# Patient Record
Sex: Male | Born: 1980 | Race: Black or African American | Hispanic: No | Marital: Married | Smoking: Never smoker
Health system: Southern US, Community
[De-identification: ages and names within clinical notes are randomized; demographics above are authoritative.]

## PROBLEM LIST (undated history)

## (undated) DIAGNOSIS — T79A22A Traumatic compartment syndrome of left lower extremity, initial encounter: Secondary | ICD-10-CM

## (undated) DIAGNOSIS — T6701XA Heatstroke and sunstroke, initial encounter: Secondary | ICD-10-CM

## (undated) DIAGNOSIS — I2699 Other pulmonary embolism without acute cor pulmonale: Secondary | ICD-10-CM

## (undated) DIAGNOSIS — I82409 Acute embolism and thrombosis of unspecified deep veins of unspecified lower extremity: Secondary | ICD-10-CM

## (undated) DIAGNOSIS — G43909 Migraine, unspecified, not intractable, without status migrainosus: Secondary | ICD-10-CM

---

## 2001-11-02 DIAGNOSIS — T79A22A Traumatic compartment syndrome of left lower extremity, initial encounter: Secondary | ICD-10-CM

## 2001-11-02 DIAGNOSIS — T6701XA Heatstroke and sunstroke, initial encounter: Secondary | ICD-10-CM

## 2001-11-02 HISTORY — DX: Heatstroke and sunstroke, initial encounter: T67.01XA

## 2001-11-02 HISTORY — PX: OTHER SURGICAL HISTORY: SHX169

## 2001-11-02 HISTORY — DX: Traumatic compartment syndrome of left lower extremity, initial encounter: T79.A22A

## 2016-07-05 ENCOUNTER — Encounter (HOSPITAL_BASED_OUTPATIENT_CLINIC_OR_DEPARTMENT_OTHER): Payer: Self-pay | Admitting: *Deleted

## 2016-07-05 ENCOUNTER — Emergency Department (HOSPITAL_BASED_OUTPATIENT_CLINIC_OR_DEPARTMENT_OTHER)
Admission: EM | Admit: 2016-07-05 | Discharge: 2016-07-05 | Disposition: A | Discharge status: Home / Self Care | Payer: Non-veteran care | Attending: Emergency Medicine | Admitting: Emergency Medicine

## 2016-07-05 ENCOUNTER — Emergency Department (HOSPITAL_BASED_OUTPATIENT_CLINIC_OR_DEPARTMENT_OTHER): Payer: Non-veteran care

## 2016-07-05 DIAGNOSIS — S8392XA Sprain of unspecified site of left knee, initial encounter: Secondary | ICD-10-CM | POA: Diagnosis not present

## 2016-07-05 DIAGNOSIS — X501XXA Overexertion from prolonged static or awkward postures, initial encounter: Secondary | ICD-10-CM | POA: Insufficient documentation

## 2016-07-05 DIAGNOSIS — S8992XA Unspecified injury of left lower leg, initial encounter: Secondary | ICD-10-CM | POA: Diagnosis present

## 2016-07-05 DIAGNOSIS — Y999 Unspecified external cause status: Secondary | ICD-10-CM | POA: Insufficient documentation

## 2016-07-05 DIAGNOSIS — Y929 Unspecified place or not applicable: Secondary | ICD-10-CM | POA: Diagnosis not present

## 2016-07-05 DIAGNOSIS — Y9301 Activity, walking, marching and hiking: Secondary | ICD-10-CM | POA: Diagnosis not present

## 2016-07-05 HISTORY — DX: Heatstroke and sunstroke, initial encounter: T67.01XA

## 2016-07-05 HISTORY — DX: Traumatic compartment syndrome of left lower extremity, initial encounter: T79.A22A

## 2016-07-05 MED ORDER — IBUPROFEN 800 MG PO TABS: 800.0000 mg | ORAL_TABLET | Freq: Three times a day (TID) | ORAL | 0 refills | Status: AC

## 2016-07-05 MED ORDER — IBUPROFEN 800 MG PO TABS
800.0000 mg | ORAL_TABLET | Freq: Once | ORAL | Status: AC
  Administered 2016-07-05: 800 mg via ORAL
  Filled 2016-07-05: qty 1

## 2016-07-05 NOTE — Discharge Instructions (Signed)

## 2016-07-05 NOTE — ED Provider Notes (Signed)
[facility] Provider Note   CSN: [id number] Arrival date & time: 07/05/16  0941     History   Chief Complaint Chief Complaint  Patient presents with  . Knee Pain    HPI Robert Rodriguez is a 35 y.o. male.  The patient is a 35 year old male, he presents to the hospital approximately 2 days after falling while walking downstairs, missed a stair, twisted his knee and fell. He reports that he was able to get up and walk but has had significant knee pain on the left since that time. 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 The patient has no other injuries including neck back or head. He does endorse mild amount of swelling around the left knee. He feels as though his knee is going to give out when he walks    Knee Pain      Past Medical History:  Diagnosis Date  . 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 Heat stroke 2003    There are no active problems to display for this patient.   Past Surgical History:  Procedure Laterality Date  . compartment syndrome Left 2003   with skin graft       Home Medications    Prior to Admission medications   Medication Sig Start Date End Date Taking? Authorizing Provider  ibuprofen (ADVIL,MOTRIN) 800 MG tablet Take 1 tablet (800 mg total) by mouth 3 (three) times daily. 07/05/16   Eber Hong, MD    Family History No family history on file.  Social History Social History  Substance Use Topics  . Smoking status: Never Smoker  . Smokeless tobacco: Never Used  . Alcohol use Yes     Comment: several times monthly     Allergies   Review of patient's allergies indicates no known allergies.   Review of Systems Review of Systems  All other systems reviewed and are negative.    Physical Exam Updated Vital Signs BP 120/99  (BP Location: Right Arm)   Pulse 74   Temp 98.2 F (36.8 C) (Oral)   Resp 18   Ht 5\' 7"  (1.702 m)   Wt 225 lb (102.1 kg)   SpO2 98%   BMI 35.24 kg/m   Physical Exam  Constitutional: He appears well-developed and well-nourished. No distress.  HENT:  Head: Normocephalic and atraumatic.  Eyes: Conjunctivae are normal. No scleral icterus.  Cardiovascular: Normal rate, regular rhythm and intact distal pulses.   Pulmonary/Chest: Effort normal and breath sounds normal.  Musculoskeletal: He exhibits tenderness ( ttp over the superior and medial knee - minimal pain with lateralizing movement of the patella.  ). He exhibits no edema.  Able to straight leg raise, extensor mechanism intact, no ttp over the patellar tendon, no joint laxity - no drawer sign.  Pain with medial stress and along the medial joint line  Neurological: He is alert.  Able to straight leg raise bilaterally, mild weakness with dorsiflexion of the L foot at the ankle  Skin: Skin is warm and dry. No rash noted. He is not diaphoretic.  Scarring from fasciotomy well healed.    Nursing note and vitals reviewed.    ED Treatments / Results  Labs (all labs ordered are listed, but only abnormal results are displayed) Labs Reviewed - No data to  display   Radiology Dg Knee Complete 4 Views Left  Result Date: 07/05/2016 CLINICAL DATA:  Acute left knee pain following fall 3 days ago. Initial encounter. EXAM: LEFT KNEE - COMPLETE 4+ VIEW COMPARISON:  None. FINDINGS: No evidence of fracture, dislocation, or joint effusion. No evidence of arthropathy or other focal bone abnormality. Soft tissues are unremarkable. IMPRESSION: Negative. Electronically Signed   By: Harmon PierJeffrey  Hu M.D.   On: 07/05/2016 10:35    Procedures Procedures (including critical care time)  Medications Ordered in ED Medications  ibuprofen (ADVIL,MOTRIN) tablet 800 mg (800 mg Oral Given 07/05/16 1020)     Initial Impression / Assessment and Plan / ED Course  I  have reviewed the triage vital signs and the nursing notes.  Pertinent labs & imaging results that were available during my care of the patient were reviewed by me and considered in my medical decision making (see chart for details).  Clinical Course    Xray negative - pt well appearing, stable for d/c with immobilizer, meds and RICE therapy.  Possible internal derangement - needs ortho / sports med f/u - information give for Dr. Pearletha Forgehudnall - [facility] goes [facility].  Final Clinical Impressions(s) / ED Diagnoses   Final diagnoses:  Left knee sprain, initial encounter    New Prescriptions New Prescriptions   IBUPROFEN (ADVIL,MOTRIN) 800 MG TABLET    Take 1 tablet (800 mg total) by mouth 3 (three) times daily.     Eber HongBrian Monik Lins, MD 07/05/16 [phone]

## 2016-07-05 NOTE — ED Triage Notes (Signed)
Pt reports this past Friday he was going down some stairs, missed a step and twisted his L knee. Reports mild, intermittent numbness/tingling in toes but states it's from an old injury.

## 2016-07-05 NOTE — ED Notes (Signed)
MD at bedside. 

## 2016-07-05 NOTE — ED Notes (Signed)
Pt expresses relief upon immobilizer application.

## 2018-07-06 ENCOUNTER — Encounter (HOSPITAL_COMMUNITY): Payer: Self-pay | Admitting: Emergency Medicine

## 2018-07-06 ENCOUNTER — Emergency Department (HOSPITAL_COMMUNITY)
Admission: EM | Admit: 2018-07-06 | Discharge: 2018-07-06 | Disposition: A | Discharge status: Home / Self Care | Payer: Non-veteran care | Attending: Emergency Medicine | Admitting: Emergency Medicine

## 2018-07-06 ENCOUNTER — Emergency Department (HOSPITAL_COMMUNITY): Payer: Non-veteran care

## 2018-07-06 DIAGNOSIS — J189 Pneumonia, unspecified organism: Secondary | ICD-10-CM

## 2018-07-06 DIAGNOSIS — J181 Lobar pneumonia, unspecified organism: Secondary | ICD-10-CM | POA: Insufficient documentation

## 2018-07-06 DIAGNOSIS — R109 Unspecified abdominal pain: Secondary | ICD-10-CM | POA: Diagnosis present

## 2018-07-06 LAB — CBC WITH DIFFERENTIAL/PLATELET
BASOS ABS: 0.2 10*3/uL — AB (ref 0.0–0.1)
Basophils Relative: 1 %
EOS ABS: 8.7 10*3/uL — AB (ref 0.0–0.7)
Eosinophils Relative: 45 %
HCT: 42.4 % (ref 39.0–52.0)
HEMOGLOBIN: 13.9 g/dL (ref 13.0–17.0)
LYMPHS PCT: 10 %
Lymphs Abs: 1.9 10*3/uL (ref 0.7–4.0)
MCH: 28.4 pg (ref 26.0–34.0)
MCHC: 32.8 g/dL (ref 30.0–36.0)
MCV: 86.7 fL (ref 78.0–100.0)
Monocytes Absolute: 0.8 10*3/uL (ref 0.1–1.0)
Monocytes Relative: 4 %
Neutro Abs: 7.7 10*3/uL (ref 1.7–7.7)
Neutrophils Relative %: 40 %
Platelets: 40 10*3/uL — ABNORMAL LOW (ref 150–400)
RBC: 4.89 MIL/uL (ref 4.22–5.81)
RDW: 12.2 % (ref 11.5–15.5)
WBC: 19.3 10*3/uL — AB (ref 4.0–10.5)

## 2018-07-06 LAB — COMPREHENSIVE METABOLIC PANEL
ALT: 109 U/L — AB (ref 0–44)
ANION GAP: 9 (ref 5–15)
AST: 51 U/L — ABNORMAL HIGH (ref 15–41)
Albumin: 3.5 g/dL (ref 3.5–5.0)
Alkaline Phosphatase: 138 U/L — ABNORMAL HIGH (ref 38–126)
BUN: 11 mg/dL (ref 6–20)
CHLORIDE: 102 mmol/L (ref 98–111)
CO2: 27 mmol/L (ref 22–32)
Calcium: 8.6 mg/dL — ABNORMAL LOW (ref 8.9–10.3)
Creatinine, Ser: 1.31 mg/dL — ABNORMAL HIGH (ref 0.61–1.24)
GFR calc Af Amer: >60 mL/min (ref >60)
Glucose, Bld: 112 mg/dL — ABNORMAL HIGH (ref 70–99)
POTASSIUM: 3.7 mmol/L (ref 3.5–5.1)
SODIUM: 138 mmol/L (ref 135–145)
Total Bilirubin: 1.1 mg/dL (ref 0.3–1.2)
Total Protein: 7 g/dL (ref 6.5–8.1)

## 2018-07-06 LAB — URINALYSIS, ROUTINE W REFLEX MICROSCOPIC
BILIRUBIN URINE: NEGATIVE
GLUCOSE, UA: NEGATIVE mg/dL
Ketones, ur: 5 mg/dL — AB
Leukocytes, UA: NEGATIVE
NITRITE: NEGATIVE
PROTEIN: NEGATIVE mg/dL
Specific Gravity, Urine: 1.025 (ref 1.005–1.030)
pH: 5 (ref 5.0–8.0)

## 2018-07-06 LAB — CK: CK TOTAL: 559 U/L — AB (ref 49–397)

## 2018-07-06 MED ORDER — IOPAMIDOL (ISOVUE-300) INJECTION 61%
INTRAVENOUS | Status: AC
  Filled 2018-07-06: qty 100

## 2018-07-06 MED ORDER — CEPHALEXIN 500 MG PO CAPS: 500.0000 mg | ORAL_CAPSULE | Freq: Four times a day (QID) | ORAL | 0 refills | Status: AC

## 2018-07-06 MED ORDER — IOPAMIDOL (ISOVUE-M 300) INJECTION 61%
15.0000 mL | Freq: Once | INTRAMUSCULAR | Status: AC | PRN
  Administered 2018-07-06: 15 mL via INTRATHECAL

## 2018-07-06 MED ORDER — SODIUM CHLORIDE 0.9 % IV BOLUS
2000.0000 mL | Freq: Once | INTRAVENOUS | Status: AC
  Administered 2018-07-06: 2000 mL via INTRAVENOUS

## 2018-07-06 MED ORDER — ACETAMINOPHEN 500 MG PO TABS
1000.0000 mg | ORAL_TABLET | Freq: Once | ORAL | Status: AC
Start: 2018-07-06 — End: 2018-07-06
  Administered 2018-07-06: 1000 mg via ORAL
  Filled 2018-07-06: qty 2

## 2018-07-06 MED ORDER — AZITHROMYCIN 250 MG PO TABS: 250.0000 mg | ORAL_TABLET | Freq: Every day | ORAL | 0 refills | Status: AC

## 2018-07-06 NOTE — ED Provider Notes (Signed)
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  He reports that he has not been drinking well and has not voided since 638 this morning.  He reports a headache and muscle cramps in his bilateral lower legs.  He also reports that he has been having flank pain over the last few days.  No treatment prior to arrival.  ROS: Muscle cramps, fever, tachycardia, flank pain   Physical Exam:   Gen: No distress  Neuro: Awake and Alert  Skin: Warm    Focused Exam: Abdomen is soft, non-distended. NAD.  Tachycardia.   Initiation of care has begun. The patient has been counseled on the process, plan, and necessity for staying for the completion/evaluation, and the remainder of the medical screening examination    Barkley Boards, PA-C 07/06/18 1703    Arby Barrette, MD 07/21/18 1050

## 2018-07-06 NOTE — Discharge Instructions (Signed)
Take tylenol 2 pills 4 times a day and motrin 4 pills 3 times a day.  Drink plenty of fluids.  Return for worsening shortness of breath, headache, confusion. Follow up with your family doctor.   

## 2018-07-06 NOTE — ED Notes (Signed)
Pt now endorsing right flank pain x2 days with "dark" urine. Temp noted to be 102.5.

## 2018-07-06 NOTE — ED Triage Notes (Signed)
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 Pt reports today began having a headache, leg cramps, and "wasn't sweating as much." pt in NAD at this time.

## 2018-07-06 NOTE — ED Notes (Signed)
Patient transported to CT SCAN . 

## 2018-07-06 NOTE — ED Provider Notes (Signed)
[facility] EMERGENCY DEPARTMENT Provider Note   CSN: [id number] Arrival date & time: 07/06/18  1618     History   Chief Complaint Chief Complaint  Patient presents with  . Heat Exposure    HPI Robert Rodriguez is a 37 y.o. male.  37 yo M with a cc of possible heat stroke.  Started about 2 days ago. Having chills, back pain, headaches.  Back pain is throbbing right sided.  Today started having leg cramps.   Drank 5 bottles of water and gatorade without improvement.  1 urinary event today.  Denies n/v, cp.    The history is provided by the patient.  Illness  This is a new problem. The current episode started 2 days ago. The problem occurs constantly. The problem has been rapidly worsening. Pertinent negatives include no chest pain, no abdominal pain, no headaches and no shortness of breath. Nothing aggravates the symptoms. Nothing relieves the symptoms. He has tried nothing for the symptoms. The treatment provided no relief.    Past Medical History:  Diagnosis Date  . 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 Heat stroke 2003    There are no active problems to display for this patient.   Past Surgical History:  Procedure Laterality Date  . compartment syndrome Left 2003   with skin graft        Home Medications    Prior to Admission medications   Medication Sig Start Date End Date Taking? Authorizing Provider  ibuprofen (ADVIL,MOTRIN) 800 MG tablet Take 1 tablet (800 mg total) by mouth 3 (three) times daily. 07/05/16   Eber Hong, MD    Family History History reviewed. No pertinent family history.  Social History Social History   Tobacco Use  . Smoking status: Never Smoker  . Smokeless tobacco: Never Used  Substance Use Topics  . Alcohol use: Yes    Comment: several times monthly  . Drug use: No     Allergies   Patient has no known allergies.   Review of Systems Review of Systems  Constitutional: Positive for  chills and fever.  HENT: Negative for congestion and facial swelling.   Eyes: Negative for discharge and visual disturbance.  Respiratory: Negative for shortness of breath.   Cardiovascular: Negative for chest pain and palpitations.  Gastrointestinal: Negative for abdominal pain, diarrhea and vomiting.  Genitourinary: Positive for flank pain.  Musculoskeletal: Positive for back pain. Negative for arthralgias and myalgias.  Skin: Negative for color change and rash.  Neurological: Negative for tremors, syncope and headaches.  Psychiatric/Behavioral: Negative for confusion and dysphoric mood.     Physical Exam Updated Vital Signs BP (!) 124/98 (BP Location: Right Arm)   Pulse (!) 108   Temp (!) 102.5 F (39.2 C) (Oral)   Resp 18   SpO2 100%   Physical Exam  Constitutional: He is oriented to person, place, and time. He appears well-developed and well-nourished.  HENT:  Head: Normocephalic and atraumatic.  Swollen turbinates, posterior nasal drip erythematous vesicular lesions to the posterior oropharynx., no noted sinus ttp, tm normal bilaterally.     Eyes: Pupils are equal, round, and reactive to light. EOM are normal.  Neck: Normal range of motion. Neck supple. No JVD present.  Cardiovascular: Normal rate and regular rhythm. Exam reveals no gallop and no friction rub.  No murmur heard. Pulmonary/Chest: No respiratory distress. He has no wheezes.  Abdominal: He exhibits no distension and no mass. There is no tenderness. There is  no rebound and no guarding.  Musculoskeletal: Normal range of motion.  R cva pain  Neurological: He is alert and oriented to person, place, and time.  Skin: No rash noted. No pallor.  Psychiatric: He has a normal mood and affect. His behavior is normal.  Nursing note and vitals reviewed.    ED Treatments / Results  Labs (all labs ordered are listed, but only abnormal results are displayed) Labs Reviewed  CBC WITH DIFFERENTIAL/PLATELET - Abnormal;  Notable for the following components:      Result Value   WBC 19.3 (*)    All other components within normal limits  COMPREHENSIVE METABOLIC PANEL  URINALYSIS, ROUTINE W REFLEX MICROSCOPIC  CK    EKG None  Radiology No results found.  Procedures Procedures (including critical care time)  Medications Ordered in ED Medications  sodium chloride 0.9 % bolus 2,000 mL (has no administration in time range)     Initial Impression / Assessment and Plan / ED Course  I have reviewed the triage vital signs and the nursing notes.  Pertinent labs & imaging results that were available during my care of the patient were reviewed by me and considered in my medical decision making (see chart for details).     37 yo M with a cc of fever and right flank pain.  Going on for the past couple of days.  He feels likely to be heat stroke, has had this in the past.    My exam the patient has some epigastric tenderness.  No pain to the right upper quadrant.  Negative Murphy sign.  My concern is that he has had a fever and right-sided back pain, could this be acute cholecystitis though does not seem to be related to food intake.  He does have mild LFT elevation.  Also with significant elevation of his white blood cell count.  UA negative for infection.  We will give 2 L of fluids obtain a CT scan with contrast, chest x-ray to evaluate for pneumonia.  CT scan of the abdomen pelvis with right lower lobe pneumonia.  Gallbladder appears normal.  May be mild colonic wall thickening.  Will treat for community acquired pneumonia as the patient is coughing and febrile.  We will have him follow-up with his PCP.  8:51 PM:  I have discussed the diagnosis/risks/treatment options with the patient and family and believe the pt to be eligible for discharge home to follow-up with PCP. We also discussed returning to the ED immediately if new or worsening sx occur. We discussed the sx which are most concerning (e.g., sudden  worsening pain, fever, inability to tolerate by mouth) that necessitate immediate return. Medications administered to the patient during their visit and any new prescriptions provided to the patient are listed below.  Medications given during this visit Medications  iopamidol (ISOVUE-300) 61 % injection (has no administration in time range)  sodium chloride 0.9 % bolus 2,000 mL (0 mLs Intravenous Stopped 07/06/18 2024)  acetaminophen (TYLENOL) tablet 1,000 mg (1,000 mg Oral Given 07/06/18 1922)  iopamidol (ISOVUE-M) 61 % intrathecal injection 15 mL (15 mLs Intrathecal Contrast Given 07/06/18 1933)     The patient appears reasonably screen and/or stabilized for discharge and I doubt any other medical condition or other [facility] requiring further screening, evaluation, or treatment in the ED at this time prior to discharge.    Final Clinical Impressions(s) / ED Diagnoses   Final diagnoses:  None    ED Discharge Orders  None       Melene Plan, DO 07/06/18 2052

## 2018-07-06 NOTE — ED Notes (Signed)
Patient transported to X-ray 

## 2018-07-07 LAB — PATHOLOGIST SMEAR REVIEW

## 2020-03-25 ENCOUNTER — Other Ambulatory Visit: Payer: Self-pay

## 2020-03-25 ENCOUNTER — Emergency Department (HOSPITAL_BASED_OUTPATIENT_CLINIC_OR_DEPARTMENT_OTHER)
Admission: EM | Admit: 2020-03-25 | Discharge: 2020-03-25 | Disposition: A | Discharge status: Home / Self Care | Payer: No Typology Code available for payment source | Attending: Emergency Medicine | Admitting: Emergency Medicine

## 2020-03-25 ENCOUNTER — Encounter (HOSPITAL_BASED_OUTPATIENT_CLINIC_OR_DEPARTMENT_OTHER): Payer: Self-pay | Admitting: *Deleted

## 2020-03-25 DIAGNOSIS — Z20822 Contact with and (suspected) exposure to covid-19: Secondary | ICD-10-CM | POA: Insufficient documentation

## 2020-03-25 DIAGNOSIS — R519 Headache, unspecified: Secondary | ICD-10-CM | POA: Diagnosis present

## 2020-03-25 DIAGNOSIS — G43909 Migraine, unspecified, not intractable, without status migrainosus: Secondary | ICD-10-CM | POA: Insufficient documentation

## 2020-03-25 HISTORY — DX: Other pulmonary embolism without acute cor pulmonale: I26.99

## 2020-03-25 HISTORY — DX: Migraine, unspecified, not intractable, without status migrainosus: G43.909

## 2020-03-25 HISTORY — DX: Acute embolism and thrombosis of unspecified deep veins of unspecified lower extremity: I82.409

## 2020-03-25 LAB — SARS CORONAVIRUS 2 BY RT PCR (HOSPITAL ORDER, PERFORMED IN ~~LOC~~ HOSPITAL LAB): SARS Coronavirus 2: NEGATIVE

## 2020-03-25 MED ORDER — SODIUM CHLORIDE 0.9 % IV BOLUS
1000.0000 mL | Freq: Once | INTRAVENOUS | Status: AC
  Administered 2020-03-25: 1000 mL via INTRAVENOUS

## 2020-03-25 MED ORDER — KETOROLAC TROMETHAMINE 30 MG/ML IJ SOLN
30.0000 mg | Freq: Once | INTRAMUSCULAR | Status: AC
  Administered 2020-03-25: 30 mg via INTRAVENOUS
  Filled 2020-03-25: qty 1

## 2020-03-25 MED ORDER — PROCHLORPERAZINE EDISYLATE 10 MG/2ML IJ SOLN
10.0000 mg | Freq: Once | INTRAMUSCULAR | Status: AC
  Administered 2020-03-25: 10 mg via INTRAVENOUS
  Filled 2020-03-25: qty 2

## 2020-03-25 MED ORDER — DIPHENHYDRAMINE HCL 50 MG/ML IJ SOLN
25.0000 mg | Freq: Once | INTRAMUSCULAR | Status: AC
  Administered 2020-03-25: 25 mg via INTRAVENOUS
  Filled 2020-03-25: qty 1

## 2020-03-25 MED ORDER — ACETAMINOPHEN 500 MG PO TABS
1000.0000 mg | ORAL_TABLET | Freq: Once | ORAL | Status: AC
  Administered 2020-03-25: 1000 mg via ORAL
  Filled 2020-03-25: qty 2

## 2020-03-25 NOTE — ED Provider Notes (Signed)
[facility] EMERGENCY DEPARTMENT Provider Note   CSN: [id number] Arrival date & time: 03/25/20  1954     History Chief Complaint  Patient presents with  . Migraine    Robert Rodriguez is a 39 y.o. male who presents for evaluation of headache that began this morning.  He states that headache started off mild in nature and gradually worsened.  He tried taking a acetaminophen/caffeine pill that he has at home with no improvement in symptoms.  He states pain is like a pressure and throbbing sensation in the back of his head that wraps around to the front behind his eye.  He states that he has a history of migraines and that this feels similar.  No preceding trauma, injury, fall.  He states that he has not yet seen a neurologist.  He is supposed to see a neurologist scheduled through the [place] but has not been able to get an appointment yet.  He reports that he has not had any associated fevers, vision changes, photophobia, nausea/vomiting, numbness/weakness of his arms or legs.  The history is provided by the patient.       Past Medical History:  Diagnosis Date  . 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 DVT (deep venous thrombosis) (HCC)   . Heat stroke 2003  . Migraines   . Pulmonary embolism (HCC)     There are no problems to display for this patient.   Past Surgical History:  Procedure Laterality Date  . compartment syndrome Left 2003   with skin graft       History reviewed. No pertinent family history.  Social History   Tobacco Use  . Smoking status: Never Smoker  . Smokeless tobacco: Never Used  Substance Use Topics  . Alcohol use: Yes    Comment: several times monthly  . Drug use: No    Home Medications Prior to Admission medications   Medication Sig Start Date End Date Taking? Authorizing Provider  azithromycin (ZITHROMAX) 250 MG tablet Take 1 tablet (250 mg total) by mouth daily. Take first 2 tablets together, then 1 every day until  finished. 07/06/18   Melene Plan, DO  cephALEXin (KEFLEX) 500 MG capsule Take 1 capsule (500 mg total) by mouth 4 (four) times daily. 07/06/18   Melene Plan, DO  ibuprofen (ADVIL,MOTRIN) 800 MG tablet Take 1 tablet (800 mg total) by mouth 3 (three) times daily. 07/05/16   Eber Hong, MD    Allergies    Patient has no known allergies.  Review of Systems   Review of Systems  Constitutional: Negative for fever.  Eyes: Negative for visual disturbance.  Respiratory: Negative for shortness of breath.   Gastrointestinal: Negative for nausea and vomiting.  Neurological: Positive for headaches. Negative for weakness and numbness.  All other systems reviewed and are negative.   Physical Exam Updated Vital Signs BP 121/81 (BP Location: Right Arm)   Pulse 88   Temp 98.9 F (37.2 C)   Resp 19   Ht 5\' 7"  (1.702 m)   Wt 86.2 kg   SpO2 95%   BMI 29.76 kg/m   Physical Exam Vitals and nursing note reviewed.  Constitutional:      Appearance: He is well-developed.  HENT:     Head: Normocephalic and atraumatic.  Eyes:     General: No scleral icterus.       Right eye: No discharge.        Left eye: No discharge.  Conjunctiva/sclera: Conjunctivae normal.     Comments: PERRL. EOMs intact. No nystagmus. No neglect.   Neck:     Comments: Neck is supple and without rigidity.  Pulmonary:     Effort: Pulmonary effort is normal.  Abdominal:     Comments: Abdomen is soft, non-distended, non-tender. No rigidity, No guarding. No peritoneal signs.  Skin:    General: Skin is warm and dry.  Neurological:     Mental Status: He is alert.     Comments: Cranial nerves III-XII intact Follows commands, Moves all extremities  5/5 strength to BUE and BLE  Sensation intact throughout all major nerve distributions No gait abnormalitiea No slurred speech. No facial droop.   Psychiatric:        Speech: Speech normal.        Behavior: Behavior normal.     ED Results / Procedures / Treatments    Labs (all labs ordered are listed, but only abnormal results are displayed) Labs Reviewed  SARS CORONAVIRUS 2 BY RT PCR ([place] [facility])  SARS CORONAVIRUS 2 (TAT 6-24 HRS)    EKG None  Radiology No results found.  Procedures Procedures (including critical care time)  Medications Ordered in ED Medications  sodium chloride 0.9 % bolus 1,000 mL (0 mLs Intravenous Stopped 03/25/20 2238)  prochlorperazine (COMPAZINE) injection 10 mg (10 mg Intravenous Given 03/25/20 2028)  diphenhydrAMINE (BENADRYL) injection 25 mg (25 mg Intravenous Given 03/25/20 2029)  ketorolac (TORADOL) 30 MG/ML injection 30 mg (30 mg Intravenous Given 03/25/20 2028)  acetaminophen (TYLENOL) tablet 1,000 mg (1,000 mg Oral Given 03/25/20 2031)    ED Course  I have reviewed the triage vital signs and the nursing notes.  Pertinent labs & imaging results that were available during my care of the patient were reviewed by me and considered in my medical decision making (see chart for details).    MDM Rules/Calculators/A&P                      39 year old male who presents for evaluation of migraine headache.  He states this feels consistent with his headache.  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  No vision changes, numbness/weakness.  No nausea/vomiting.  On initially arrival, he does have a fever.  Vitals are otherwise stable.  He denies any abdominal pain, difficulty breathing, cough.  We will plan to Covid swab him.  No neurodeficits noted on exam.  He states this feels like his normal headache.  No preceding trauma, injury.  No indication for head CT.  On exam, his neck is soft without any signs of nuchal rigidity.  Exam not concerning for meningitis.  Exam not concerning for CVA, to cranial hemorrhage.  Do not suspect dural venous thrombosis.  Additionally, patient reports no recent tick bites, rash.  I evaluated his back  and abdomen and upper extremities with no signs of bug bites.  He states that he does not have an area that is concerning for insect bite.  Reevaluation after migraine cocktail.  Patient reports feeling better.  Headache is improved.  He appears more alert.  Fever has gone down with Tylenol.  We will plan to p.o. challenge.  Patient able to tolerate p.o. without any difficulty.  I ambulated him in department with no signs of gait abnormalities.  Patient states he is ready to go home.  His initial rapid Covid  test was negative.  We will plan to do a send out Covid test given fever.  At this time, patient is hemodynamically stable, able to tolerate p.o. and ambulate in the ED.  He does have an appointment with outpatient neuro that he is established with the [place]. At this time, patient exhibits no emergent life-threatening condition that require further evaluation in ED. Patient had ample opportunity for questions and discussion. All patient's questions were answered with full understanding. Strict return precautions discussed. Patient expresses understanding and agreement to plan.   Robert Rodriguez was evaluated in Emergency Department on 03/25/2020 for the symptoms described in the history of present illness. He was evaluated in the context of the global COVID-19 pandemic, which necessitated consideration that the patient might be at risk for infection with the SARS-CoV-2 virus that causes COVID-19. Institutional protocols and algorithms that pertain to the evaluation of patients at risk for COVID-19 are in a state of rapid change based on information released by regulatory bodies including the CDC and federal and state organizations. These policies and algorithms were followed during the patient's care in the ED.  Portions of this note were generated with Scientist, clinical (histocompatibility and immunogenetics). Dictation errors may occur despite best attempts at proofreading.   Final Clinical Impression(s) / ED Diagnoses Final diagnoses:   Migraine without status migrainosus, not intractable, unspecified migraine type    Rx / DC Orders ED Discharge Orders    None       Rosana Hoes 03/25/20 2259    Gwyneth Sprout, MD 03/26/20 2045

## 2020-03-25 NOTE — ED Triage Notes (Signed)
C/o " migraine' x 15 hrs , HX of same , [facility]

## 2020-03-25 NOTE — Discharge Instructions (Signed)
You can take Tylenol or Ibuprofen as directed for pain. You can alternate Tylenol and Ibuprofen every 4 hours. If you take Tylenol at 1pm, then you can take Ibuprofen at 5pm. Then you can take Tylenol again at 9pm.   You have a Covid test pending.  He can check on line or the MyChart app regarding results.  He should quarantine until results come back.  Follow-up with the neurologist at the [facility] that you have previously scheduled.  Return the emergency department for any nausea/vomiting, numbness/weakness, rash, vomiting, abdominal pain, difficulty breathing or any other worsening or concerning symptoms.

## 2020-03-26 LAB — SARS CORONAVIRUS 2 (TAT 6-24 HRS): SARS Coronavirus 2: NEGATIVE

## 2020-10-29 ENCOUNTER — Emergency Department (HOSPITAL_BASED_OUTPATIENT_CLINIC_OR_DEPARTMENT_OTHER): Payer: No Typology Code available for payment source

## 2020-10-29 ENCOUNTER — Emergency Department (HOSPITAL_BASED_OUTPATIENT_CLINIC_OR_DEPARTMENT_OTHER)
Admission: EM | Admit: 2020-10-29 | Discharge: 2020-10-29 | Disposition: A | Discharge status: Home / Self Care | Payer: No Typology Code available for payment source | Attending: Emergency Medicine | Admitting: Emergency Medicine

## 2020-10-29 ENCOUNTER — Other Ambulatory Visit: Payer: Self-pay

## 2020-10-29 ENCOUNTER — Encounter (HOSPITAL_BASED_OUTPATIENT_CLINIC_OR_DEPARTMENT_OTHER): Payer: Self-pay | Admitting: *Deleted

## 2020-10-29 DIAGNOSIS — R0789 Other chest pain: Secondary | ICD-10-CM | POA: Insufficient documentation

## 2020-10-29 DIAGNOSIS — R0602 Shortness of breath: Secondary | ICD-10-CM | POA: Diagnosis not present

## 2020-10-29 DIAGNOSIS — M546 Pain in thoracic spine: Secondary | ICD-10-CM

## 2020-10-29 DIAGNOSIS — Z8673 Personal history of transient ischemic attack (TIA), and cerebral infarction without residual deficits: Secondary | ICD-10-CM | POA: Diagnosis not present

## 2020-10-29 LAB — BASIC METABOLIC PANEL
Anion gap: 6 (ref 5–15)
BUN: 17 mg/dL (ref 6–20)
CO2: 29 mmol/L (ref 22–32)
Calcium: 9 mg/dL (ref 8.9–10.3)
Chloride: 101 mmol/L (ref 98–111)
Creatinine, Ser: 1.09 mg/dL (ref 0.61–1.24)
GFR, Estimated: >60 mL/min (ref >60)
Glucose, Bld: 95 mg/dL (ref 70–99)
Potassium: 3.8 mmol/L (ref 3.5–5.1)
Sodium: 136 mmol/L (ref 135–145)

## 2020-10-29 LAB — CBC WITH DIFFERENTIAL/PLATELET
Abs Immature Granulocytes: 0.01 10*3/uL (ref 0.00–0.07)
Basophils Absolute: 0.1 10*3/uL (ref 0.0–0.1)
Basophils Relative: 1 %
Eosinophils Absolute: 0.6 10*3/uL — ABNORMAL HIGH (ref 0.0–0.5)
Eosinophils Relative: 8 %
HCT: 46.5 % (ref 39.0–52.0)
Hemoglobin: 15.8 g/dL (ref 13.0–17.0)
Immature Granulocytes: 0 %
Lymphocytes Relative: 47 %
Lymphs Abs: 3.2 10*3/uL (ref 0.7–4.0)
MCH: 29.1 pg (ref 26.0–34.0)
MCHC: 34 g/dL (ref 30.0–36.0)
MCV: 85.6 fL (ref 80.0–100.0)
Monocytes Absolute: 0.6 10*3/uL (ref 0.1–1.0)
Monocytes Relative: 9 %
Neutro Abs: 2.3 10*3/uL (ref 1.7–7.7)
Neutrophils Relative %: 35 %
Platelets: 229 10*3/uL (ref 150–400)
RBC: 5.43 MIL/uL (ref 4.22–5.81)
RDW: 13.2 % (ref 11.5–15.5)
WBC: 6.7 10*3/uL (ref 4.0–10.5)
nRBC: 0 % (ref 0.0–0.2)

## 2020-10-29 LAB — URINALYSIS, ROUTINE W REFLEX MICROSCOPIC
Bilirubin Urine: NEGATIVE
Glucose, UA: NEGATIVE mg/dL
Hgb urine dipstick: NEGATIVE
Ketones, ur: NEGATIVE mg/dL
Leukocytes,Ua: NEGATIVE
Nitrite: NEGATIVE
Protein, ur: NEGATIVE mg/dL
Specific Gravity, Urine: 1.03 (ref 1.005–1.030)
pH: 6 (ref 5.0–8.0)

## 2020-10-29 MED ORDER — IOHEXOL 350 MG/ML SOLN
100.0000 mL | Freq: Once | INTRAVENOUS | Status: AC | PRN
  Administered 2020-10-29: 04:00:00 100 mL via INTRAVENOUS

## 2020-10-29 NOTE — Discharge Instructions (Signed)
We did not see a blood clot on the CAT scan.  Follow-up with your doctor as needed.
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
## 2020-10-29 NOTE — ED Triage Notes (Signed)
Reports left sided upper back pain, point tenderness on palpation x 5 days. Pt denies injury. Reports hx of PE and reports that it feels the same-pt is compliant with blood thinner medication. No tachypnea or dyspnea noted.

## 2022-06-20 IMAGING — CT CT ANGIO CHEST
1 of 16 series · 10 of 38 positions shown · IV contrast (Omnipaque)
Comparison: Chest radiographs 3334 hours. CT Abdomen and Pelvis
07/06/2018.

CLINICAL DATA: 39-year-old male with left upper back pain.

EXAM:
CT ANGIOGRAPHY CHEST WITH CONTRAST
TECHNIQUE: Multidetector CT imaging of the chest was performed using the
standard protocol during bolus administration of intravenous
contrast. Multiplanar CT image reconstructions and MIPs were
obtained to evaluate the vascular anatomy.
CONTRAST:  Total of 150 mL OMNIPAQUE IOHEXOL 350 MG/ML SOLN, as the
initial contrast bolus was insufficient.

[Series 15: pe thins · axial · 0.67mm/px · z∈[+1039,+1241]mm · 10 of 329 slices shown]
[im 30/329  lung]
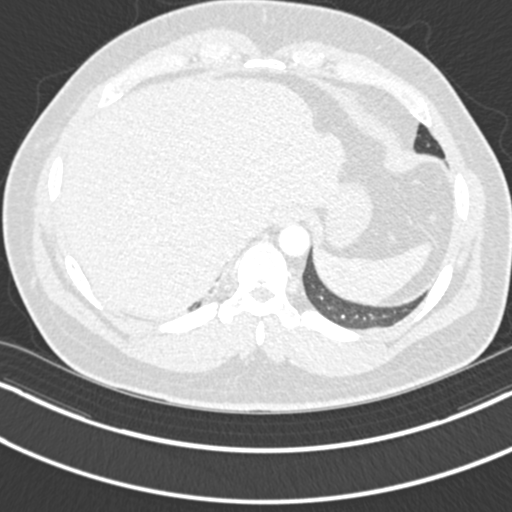
[im 60/329  mediastinal]
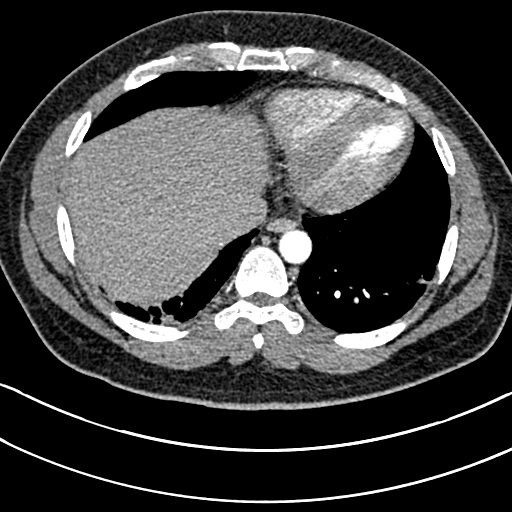
[im 90/329  lung]
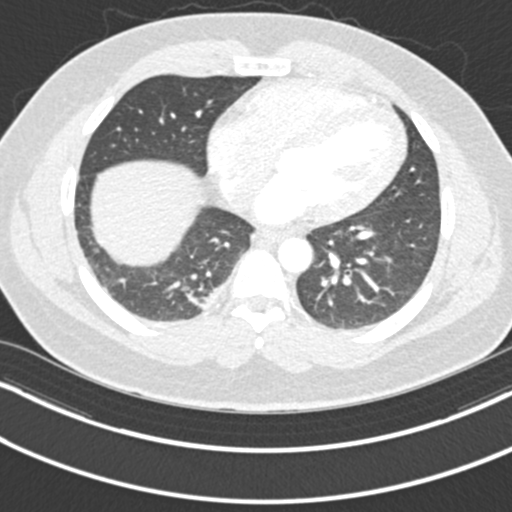
[im 120/329  mediastinal]
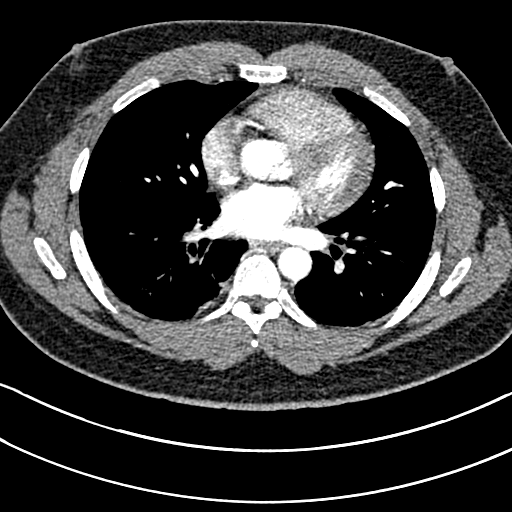
[im 150/329  lung]
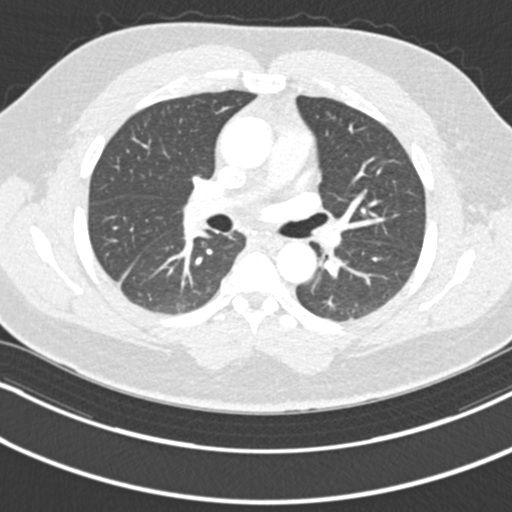
[im 179/329  mediastinal]
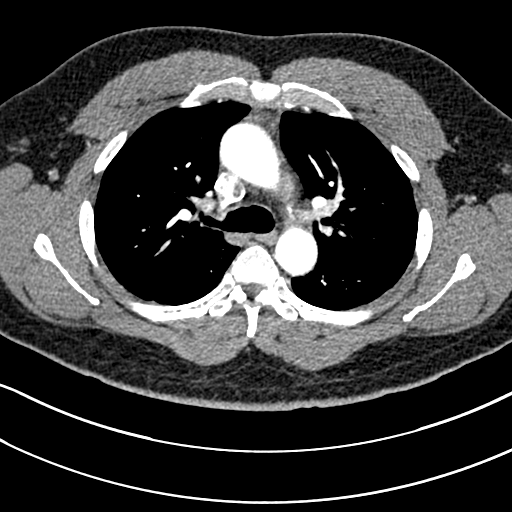
[im 209/329  lung]
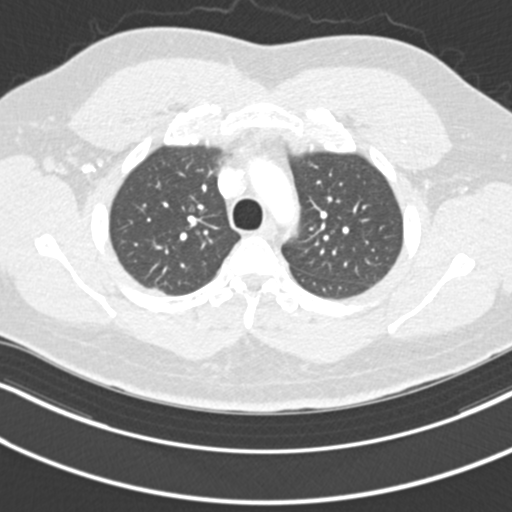
[im 239/329  mediastinal]
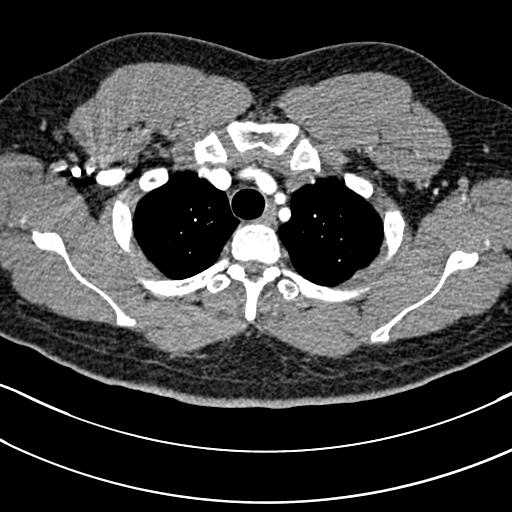
[im 269/329  lung]
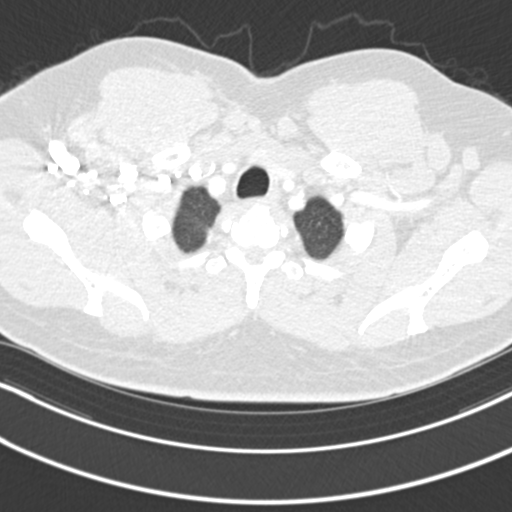
[im 299/329  mediastinal]
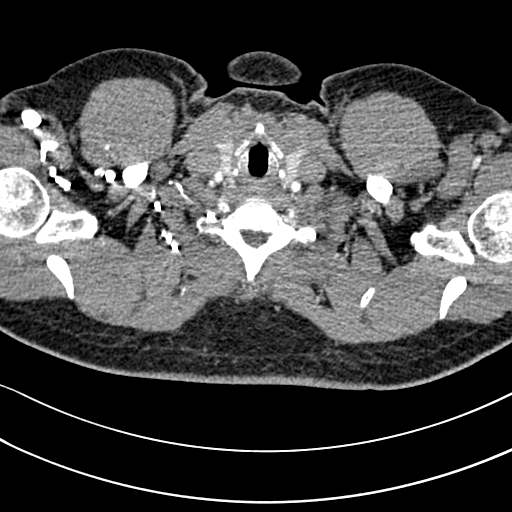

[10 of 38 positions shown; findings below may reference images not displayed]

FINDINGS: Cardiovascular: Adequate contrast bolus timing in the pulmonary
arterial tree at 5751 hours. Superimposed respiratory motion.

Central and hilar pulmonary arteries are patent. No convincing acute
pulmonary artery filling defect.

There is chronic right lower lobe scarring and architectural
distortion which does involve the segmental arteries which are
atretic.

No cardiomegaly or pericardial effusion.  Negative visible aorta.

Mediastinum/Nodes: Negative.  No mediastinal lymphadenopathy.

Lungs/Pleura: Major airways are patent. There is chronic
architectural distortion throughout the right lower lobe, sparing
the superior segment. This was the site of peribronchial and
parenchymal consolidation on the 6105 CT Abdomen and Pelvis.

Mild similar scarring in the left lateral costophrenic angle, also
the site of acute lung opacity in 6105. Upper lobes and middle lobes
are within normal limits. No pleural effusion or acute pulmonary
opacity identified.

Upper Abdomen: Stable, negative visible liver, spleen and bowel in
the upper abdomen.

Musculoskeletal: No acute osseous abnormality identified. Incidental
ununited ossification center of the T5 spinous process (normal
variant). Small C7 cervical rib on the left (normal variant).

Review of the MIP images confirms the above findings.
IMPRESSION: 1. Suboptimal despite two attempts. No convincing acute pulmonary
embolus.

2. Chronic right lower lobe scarring with architectural distortion
and atretic lower lobe pulmonary arteries. Less pronounced similar
scarring in the left costophrenic angle, and these areas are the
sequelae of lung base consolidation on a 6105 CT Abdomen and Pelvis.

3. No acute finding identified in the chest.
